# Patient Record
Sex: Female | Born: 1985 | Race: White | Hispanic: No | Smoking: Never smoker
Health system: Southern US, Community
[De-identification: ages and names within clinical notes are randomized; demographics above are authoritative.]

## PROBLEM LIST (undated history)

## (undated) DIAGNOSIS — F419 Anxiety disorder, unspecified: Secondary | ICD-10-CM

## (undated) DIAGNOSIS — G43909 Migraine, unspecified, not intractable, without status migrainosus: Secondary | ICD-10-CM

## (undated) DIAGNOSIS — F329 Major depressive disorder, single episode, unspecified: Secondary | ICD-10-CM

## (undated) DIAGNOSIS — F32A Depression, unspecified: Secondary | ICD-10-CM

## (undated) DIAGNOSIS — F909 Attention-deficit hyperactivity disorder, unspecified type: Secondary | ICD-10-CM

---

## 2016-01-08 ENCOUNTER — Encounter (HOSPITAL_BASED_OUTPATIENT_CLINIC_OR_DEPARTMENT_OTHER): Payer: Self-pay | Admitting: Emergency Medicine

## 2016-01-08 ENCOUNTER — Emergency Department (HOSPITAL_BASED_OUTPATIENT_CLINIC_OR_DEPARTMENT_OTHER): Payer: Managed Care, Other (non HMO)

## 2016-01-08 ENCOUNTER — Emergency Department (HOSPITAL_BASED_OUTPATIENT_CLINIC_OR_DEPARTMENT_OTHER)
Admission: EM | Admit: 2016-01-08 | Discharge: 2016-01-08 | Disposition: A | Discharge status: Home / Self Care | Payer: Managed Care, Other (non HMO) | Attending: Emergency Medicine | Admitting: Emergency Medicine

## 2016-01-08 DIAGNOSIS — Y9389 Activity, other specified: Secondary | ICD-10-CM | POA: Insufficient documentation

## 2016-01-08 DIAGNOSIS — W208XXA Other cause of strike by thrown, projected or falling object, initial encounter: Secondary | ICD-10-CM | POA: Insufficient documentation

## 2016-01-08 DIAGNOSIS — Z79899 Other long term (current) drug therapy: Secondary | ICD-10-CM | POA: Diagnosis not present

## 2016-01-08 DIAGNOSIS — G43909 Migraine, unspecified, not intractable, without status migrainosus: Secondary | ICD-10-CM | POA: Diagnosis not present

## 2016-01-08 DIAGNOSIS — Z791 Long term (current) use of non-steroidal anti-inflammatories (NSAID): Secondary | ICD-10-CM | POA: Diagnosis not present

## 2016-01-08 DIAGNOSIS — F909 Attention-deficit hyperactivity disorder, unspecified type: Secondary | ICD-10-CM | POA: Diagnosis not present

## 2016-01-08 DIAGNOSIS — F329 Major depressive disorder, single episode, unspecified: Secondary | ICD-10-CM | POA: Diagnosis not present

## 2016-01-08 DIAGNOSIS — Y998 Other external cause status: Secondary | ICD-10-CM | POA: Insufficient documentation

## 2016-01-08 DIAGNOSIS — M79641 Pain in right hand: Secondary | ICD-10-CM

## 2016-01-08 DIAGNOSIS — Y9289 Other specified places as the place of occurrence of the external cause: Secondary | ICD-10-CM | POA: Insufficient documentation

## 2016-01-08 DIAGNOSIS — S6991XA Unspecified injury of right wrist, hand and finger(s), initial encounter: Secondary | ICD-10-CM | POA: Diagnosis not present

## 2016-01-08 DIAGNOSIS — F419 Anxiety disorder, unspecified: Secondary | ICD-10-CM | POA: Diagnosis not present

## 2016-01-08 HISTORY — DX: Anxiety disorder, unspecified: F41.9

## 2016-01-08 HISTORY — DX: Major depressive disorder, single episode, unspecified: F32.9

## 2016-01-08 HISTORY — DX: Migraine, unspecified, not intractable, without status migrainosus: G43.909

## 2016-01-08 HISTORY — DX: Depression, unspecified: F32.A

## 2016-01-08 HISTORY — DX: Attention-deficit hyperactivity disorder, unspecified type: F90.9

## 2016-01-08 MED ORDER — ACETAMINOPHEN 325 MG PO TABS
650.0000 mg | ORAL_TABLET | Freq: Once | ORAL | Status: AC
  Administered 2016-01-08: 650 mg via ORAL
  Filled 2016-01-08: qty 2

## 2016-01-08 MED ORDER — IBUPROFEN 800 MG PO TABS: 800.0000 mg | ORAL_TABLET | Freq: Three times a day (TID) | ORAL | Status: DC
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
## 2016-01-08 NOTE — ED Notes (Signed)
CMS intact before and after. Pt tolerated procedure well. Pt did not have any questions.  
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
## 2016-01-08 NOTE — ED Notes (Signed)
In addition to reported medications, pt alsop takes a birth control pill (unknown) and a migraine preventative medication.

## 2016-01-08 NOTE — Discharge Instructions (Signed)
1. Medications: tylenol or motrin for pain, usual home medications 2. Treatment: rest, drink plenty of fluids, ice, elevate 3. Follow Up: please followup with your primary doctor for discussion of your diagnoses and further evaluation after today's visit; if you do not have a primary care doctor use the phone number listed in your discharge paperwork to find one; please return to the ER for increased pain, swelling, numbness, new or worsening symptoms

## 2016-04-08 ENCOUNTER — Emergency Department (HOSPITAL_BASED_OUTPATIENT_CLINIC_OR_DEPARTMENT_OTHER)
Admission: EM | Admit: 2016-04-08 | Discharge: 2016-04-09 | Disposition: A | Discharge status: Home / Self Care | Payer: Managed Care, Other (non HMO) | Attending: Emergency Medicine | Admitting: Emergency Medicine

## 2016-04-08 ENCOUNTER — Emergency Department (HOSPITAL_BASED_OUTPATIENT_CLINIC_OR_DEPARTMENT_OTHER): Payer: Managed Care, Other (non HMO)

## 2016-04-08 ENCOUNTER — Encounter (HOSPITAL_BASED_OUTPATIENT_CLINIC_OR_DEPARTMENT_OTHER): Payer: Self-pay | Admitting: Emergency Medicine

## 2016-04-08 DIAGNOSIS — M549 Dorsalgia, unspecified: Secondary | ICD-10-CM | POA: Insufficient documentation

## 2016-04-08 DIAGNOSIS — R300 Dysuria: Secondary | ICD-10-CM | POA: Diagnosis not present

## 2016-04-08 DIAGNOSIS — R109 Unspecified abdominal pain: Secondary | ICD-10-CM | POA: Insufficient documentation

## 2016-04-08 DIAGNOSIS — R0981 Nasal congestion: Secondary | ICD-10-CM | POA: Diagnosis not present

## 2016-04-08 DIAGNOSIS — F329 Major depressive disorder, single episode, unspecified: Secondary | ICD-10-CM | POA: Insufficient documentation

## 2016-04-08 DIAGNOSIS — R51 Headache: Secondary | ICD-10-CM | POA: Diagnosis not present

## 2016-04-08 DIAGNOSIS — R112 Nausea with vomiting, unspecified: Secondary | ICD-10-CM | POA: Insufficient documentation

## 2016-04-08 LAB — COMPREHENSIVE METABOLIC PANEL
ALBUMIN: 4.2 g/dL (ref 3.5–5.0)
ALK PHOS: 49 U/L (ref 38–126)
ALT: 32 U/L (ref 14–54)
AST: 27 U/L (ref 15–41)
Anion gap: 7 (ref 5–15)
BUN: 21 mg/dL — ABNORMAL HIGH (ref 6–20)
CALCIUM: 9 mg/dL (ref 8.9–10.3)
CHLORIDE: 109 mmol/L (ref 101–111)
CO2: 23 mmol/L (ref 22–32)
CREATININE: 0.61 mg/dL (ref 0.44–1.00)
GFR calc non Af Amer: >60 mL/min (ref >60)
GLUCOSE: 88 mg/dL (ref 65–99)
Potassium: 2.9 mmol/L — ABNORMAL LOW (ref 3.5–5.1)
SODIUM: 139 mmol/L (ref 135–145)
Total Bilirubin: 0.2 mg/dL — ABNORMAL LOW (ref 0.3–1.2)
Total Protein: 7.6 g/dL (ref 6.5–8.1)

## 2016-04-08 LAB — URINE MICROSCOPIC-ADD ON

## 2016-04-08 LAB — URINALYSIS, ROUTINE W REFLEX MICROSCOPIC
BILIRUBIN URINE: NEGATIVE
GLUCOSE, UA: NEGATIVE mg/dL
HGB URINE DIPSTICK: NEGATIVE
KETONES UR: NEGATIVE mg/dL
Nitrite: NEGATIVE
PH: 6.5 (ref 5.0–8.0)
PROTEIN: NEGATIVE mg/dL
Specific Gravity, Urine: 1.018 (ref 1.005–1.030)

## 2016-04-08 LAB — CBC
HCT: 38.9 % (ref 36.0–46.0)
Hemoglobin: 12.7 g/dL (ref 12.0–15.0)
MCH: 28.9 pg (ref 26.0–34.0)
MCHC: 32.6 g/dL (ref 30.0–36.0)
MCV: 88.6 fL (ref 78.0–100.0)
PLATELETS: 251 10*3/uL (ref 150–400)
RBC: 4.39 MIL/uL (ref 3.87–5.11)
RDW: 14.2 % (ref 11.5–15.5)
WBC: 7.6 10*3/uL (ref 4.0–10.5)

## 2016-04-08 LAB — PREGNANCY, URINE: PREG TEST UR: NEGATIVE

## 2016-04-08 LAB — LIPASE, BLOOD: LIPASE: 71 U/L — AB (ref 11–51)

## 2016-04-08 MED ORDER — POTASSIUM CHLORIDE CRYS ER 20 MEQ PO TBCR
40.0000 meq | EXTENDED_RELEASE_TABLET | Freq: Once | ORAL | Status: AC
  Administered 2016-04-09: 40 meq via ORAL
  Filled 2016-04-08: qty 2

## 2016-04-08 MED ORDER — ONDANSETRON HCL 4 MG/2ML IJ SOLN: 4.0000 mg | Freq: Once | INTRAMUSCULAR | Status: DC

## 2016-04-08 MED ORDER — PROMETHAZINE HCL 25 MG PO TABS: 25.0000 mg | ORAL_TABLET | Freq: Four times a day (QID) | ORAL | Status: AC | PRN

## 2016-04-08 MED ORDER — HYDROMORPHONE HCL 1 MG/ML IJ SOLN
1.0000 mg | Freq: Once | INTRAMUSCULAR | Status: AC
Start: 2016-04-08 — End: 2016-04-08
  Administered 2016-04-08: 1 mg via INTRAVENOUS
  Filled 2016-04-08: qty 1

## 2016-04-08 MED ORDER — IOPAMIDOL (ISOVUE-300) INJECTION 61%
100.0000 mL | Freq: Once | INTRAVENOUS | Status: AC | PRN
  Administered 2016-04-08: 100 mL via INTRAVENOUS

## 2016-04-08 MED ORDER — ONDANSETRON HCL 4 MG/2ML IJ SOLN
4.0000 mg | Freq: Once | INTRAMUSCULAR | Status: AC | PRN
  Administered 2016-04-08: 4 mg via INTRAVENOUS
  Filled 2016-04-08: qty 2

## 2016-04-08 MED ORDER — SODIUM CHLORIDE 0.9 % IV BOLUS (SEPSIS)
1000.0000 mL | Freq: Once | INTRAVENOUS | Status: AC
Start: 2016-04-08 — End: 2016-04-08
  Administered 2016-04-08: 1000 mL via INTRAVENOUS

## 2016-04-08 MED ORDER — HYDROMORPHONE HCL 1 MG/ML IJ SOLN
1.0000 mg | Freq: Once | INTRAMUSCULAR | Status: AC
  Administered 2016-04-08: 1 mg via INTRAVENOUS
  Filled 2016-04-08: qty 1

## 2016-04-08 MED ORDER — SODIUM CHLORIDE 0.9 % IV SOLN
INTRAVENOUS | Status: DC
  Administered 2016-04-08: 22:00:00 via INTRAVENOUS

## 2016-04-08 MED ORDER — HYDROCODONE-ACETAMINOPHEN 5-325 MG PO TABS: 1.0000 | ORAL_TABLET | Freq: Four times a day (QID) | ORAL | Status: AC | PRN

## 2016-04-08 NOTE — ED Provider Notes (Signed)
CSN: [id number]     Arrival date & time 04/08/16  1842 History  By signing my name below, I, Terrance Branch, attest that this documentation has been prepared under the direction and in the presence of Vanetta Mulders, MD. Electronically Signed: Evon Slack, ED Scribe. 04/08/2016. 8:40 PM.      Chief Complaint  Patient presents with  . Flank Pain    The history is provided by the patient. No language interpreter was used.   HPI Comments: Rhonda West is a 30 y.o. female who presents to the Emergency Department complaining of intermittent left sided flank pain onset 1 month prior. Pt reports that the pain has recently returned 1 day ago and now is severe and constant. Pt states that the pain does radiate around to her lower left abdomen. She states she has associated back pain, nausea, vomiting, dysuria and hematuria. Pt doesn't report any medications PTA. Pt reports that was evaluated 1 month prior for similar pain. She states that her Ct scan showed no abnormalities.      Past Medical History  Diagnosis Date  . Anxiety   . ADHD (attention deficit hyperactivity disorder)   . Depression   . Migraines    History reviewed. No pertinent past surgical history. History reviewed. No pertinent family history. Social History  Substance Use Topics  . Smoking status: Never Smoker   . Smokeless tobacco: None  . Alcohol Use: No   OB History    No data available      Review of Systems  Constitutional: Negative for fever and chills.  HENT: Positive for congestion and rhinorrhea. Negative for sore throat.   Eyes: Negative for visual disturbance.  Respiratory: Negative for cough and shortness of breath.   Cardiovascular: Negative for chest pain and leg swelling.  Gastrointestinal: Positive for nausea, vomiting and abdominal pain. Negative for diarrhea.  Genitourinary: Positive for dysuria, hematuria and flank pain.  Musculoskeletal: Positive for back pain. Negative for neck pain.   Skin: Negative for rash.  Neurological: Positive for headaches.  Hematological: Does not bruise/bleed easily.  Psychiatric/Behavioral: Negative for confusion.      Allergies  Review of patient's allergies indicates no known allergies.  Home Medications   Prior to Admission medications   Medication Sig Start Date End Date Taking? Authorizing Provider  amphetamine-dextroamphetamine (ADDERALL) 10 MG tablet Take 20 mg by mouth daily with breakfast.     Historical Provider, MD  busPIRone (BUSPAR) 5 MG tablet Take 5 mg by mouth 3 (three) times daily.    Historical Provider, MD  cetirizine (ZYRTEC) 5 MG tablet Take 5 mg by mouth daily.    Historical Provider, MD  citalopram (CELEXA) 40 MG tablet Take 40 mg by mouth daily.    Historical Provider, MD  HYDROcodone-acetaminophen (NORCO/VICODIN) 5-325 MG tablet Take 1-2 tablets by mouth every 6 (six) hours as needed for moderate pain. 04/08/16   Vanetta Mulders, MD  ibuprofen (ADVIL,MOTRIN) 800 MG tablet Take 1 tablet (800 mg total) by mouth 3 (three) times daily. 01/08/16   Mady Gemma, PA-C  promethazine (PHENERGAN) 25 MG tablet Take 1 tablet (25 mg total) by mouth every 6 (six) hours as needed for nausea or vomiting. 04/08/16   Vanetta Mulders, MD  SUMAtriptan (IMITREX) 100 MG tablet Take 100 mg by mouth every 2 (two) hours as needed for migraine. May repeat in 2 hours if headache persists or recurs.    Historical Provider, MD  traZODone (DESYREL) 50 MG tablet Take 50 mg by  mouth at bedtime.    Historical Provider, MD   BP 129/96 mmHg  Pulse 75  Temp(Src) 97.6 F (36.4 C) (Oral)  Resp 20  Ht 5\' 2"  (1.575 m)  Wt 46.72 kg  BMI 18.83 kg/m2  SpO2 100%   Physical Exam  Constitutional: She is oriented to person, place, and time. She appears well-developed and well-nourished. No distress.  HENT:  Head: Normocephalic and atraumatic.  Mouth/Throat: Oropharynx is clear and moist.  Eyes: Conjunctivae and EOM are normal. Pupils are equal,  round, and reactive to light. No scleral icterus.  Neck: Neck supple. No tracheal deviation present.  Cardiovascular: Normal rate, regular rhythm and normal heart sounds.   Pulmonary/Chest: Effort normal. No respiratory distress.  Abdominal: Bowel sounds are normal. There is CVA tenderness. There is no guarding.  Left flank tenderness.   Musculoskeletal: Normal range of motion.  Neurological: She is alert and oriented to person, place, and time. No cranial nerve deficit.  Skin: Skin is warm and dry.  Psychiatric: She has a normal mood and affect. Her behavior is normal.  Nursing note and vitals reviewed.   ED Course  Procedures (including critical care time) DIAGNOSTIC STUDIES: Oxygen Saturation is 100% on RA, normal by my interpretation.    COORDINATION OF CARE: 8:39 PM-Discussed treatment plan which includes  UA and CT scan with pt at bedside and pt agreed to plan.     Labs Review Labs Reviewed  URINALYSIS, ROUTINE W REFLEX MICROSCOPIC (NOT AT [facility]) - Abnormal; Notable for the following:    Leukocytes, UA MODERATE (*)    All other components within normal limits  URINE MICROSCOPIC-ADD ON - Abnormal; Notable for the following:    Squamous Epithelial / LPF 6-30 (*)    Bacteria, UA RARE (*)    All other components within normal limits  LIPASE, BLOOD - Abnormal; Notable for the following:    Lipase 71 (*)    All other components within normal limits  COMPREHENSIVE METABOLIC PANEL - Abnormal; Notable for the following:    Potassium 2.9 (*)    BUN 21 (*)    Total Bilirubin 0.2 (*)    All other components within normal limits  PREGNANCY, URINE  CBC   Results for orders placed or performed during the hospital encounter of 04/08/16  Pregnancy, urine  Result Value Ref Range   Preg Test, Ur NEGATIVE NEGATIVE  Urinalysis, Routine w reflex microscopic (not at [facility])  Result Value Ref Range   Color, Urine YELLOW YELLOW   APPearance CLEAR CLEAR   Specific Gravity, Urine 1.018  1.005 - 1.030   pH 6.5 5.0 - 8.0   Glucose, UA NEGATIVE NEGATIVE mg/dL   Hgb urine dipstick NEGATIVE NEGATIVE   Bilirubin Urine NEGATIVE NEGATIVE   Ketones, ur NEGATIVE NEGATIVE mg/dL   Protein, ur NEGATIVE NEGATIVE mg/dL   Nitrite NEGATIVE NEGATIVE   Leukocytes, UA MODERATE (A) NEGATIVE  Urine microscopic-add on  Result Value Ref Range   Squamous Epithelial / LPF 6-30 (A) NONE SEEN   WBC, UA 6-30 0 - 5 WBC/hpf   RBC / HPF 0-5 0 - 5 RBC/hpf   Bacteria, UA RARE (A) NONE SEEN   Urine-Other MUCOUS PRESENT   Lipase, blood  Result Value Ref Range   Lipase 71 (H) 11 - 51 U/L  Comprehensive metabolic panel  Result Value Ref Range   Sodium 139 135 - 145 mmol/L   Potassium 2.9 (L) 3.5 - 5.1 mmol/L   Chloride 109 101 - 111 mmol/L  CO2 23 22 - 32 mmol/L   Glucose, Bld 88 65 - 99 mg/dL   BUN 21 (H) 6 - 20 mg/dL   Creatinine, Ser 1.610.61 0.44 - 1.00 mg/dL   Calcium 9.0 8.9 - 09.610.3 mg/dL   Total Protein 7.6 6.5 - 8.1 g/dL   Albumin 4.2 3.5 - 5.0 g/dL   AST 27 15 - 41 U/L   ALT 32 14 - 54 U/L   Alkaline Phosphatase 49 38 - 126 U/L   Total Bilirubin 0.2 (L) 0.3 - 1.2 mg/dL   GFR calc non Af Amer >60 >60 mL/min   GFR calc Af Amer >60 >60 mL/min   Anion gap 7 5 - 15  CBC  Result Value Ref Range   WBC 7.6 4.0 - 10.5 K/uL   RBC 4.39 3.87 - 5.11 MIL/uL   Hemoglobin 12.7 12.0 - 15.0 g/dL   HCT 04.538.9 40.936.0 - 81.146.0 %   MCV 88.6 78.0 - 100.0 fL   MCH 28.9 26.0 - 34.0 pg   MCHC 32.6 30.0 - 36.0 g/dL   RDW 91.414.2 78.211.5 - 95.615.5 %   Platelets 251 150 - 400 K/uL     Imaging Review Ct Abdomen Pelvis W Contrast  04/08/2016  CLINICAL DATA:  Intermittent left-sided flank pain for 1 month. Pain radiates to the left eye removed. Nausea, vomiting, dysuria, and hematuria. EXAM: CT ABDOMEN AND PELVIS WITH CONTRAST TECHNIQUE: Multidetector CT imaging of the abdomen and pelvis was performed using the standard protocol following bolus administration of intravenous contrast. CONTRAST:  100mL ISOVUE-300 IOPAMIDOL  (ISOVUE-300) INJECTION 61% COMPARISON:  CT 03/12/2016.  MRI abdomen 04/06/2016. FINDINGS: Mild dependent atelectasis in the bases. Small focal low-attenuation lesions are demonstrated in the left lobe of the liver probably representing areas of focal fatty infiltration. The gallbladder, pancreas, adrenal glands, kidneys, abdominal aorta, inferior vena cava, and retroperitoneal lymph nodes are unremarkable. Stomach, small bowel, and colon are not abnormally distended. Diffusely stool-filled colon. There is possible wall thickening in the duodenum which could be due to underdistention or duodenitis. No free air or free fluid in the abdomen. Pelvis: Uterus and ovaries are not enlarged. Bladder wall is not thickened. No free or loculated pelvic fluid collections. No pelvic mass or lymphadenopathy. Appendix is normal. No destructive bone lesions. IMPRESSION: Suggestion of wall thickening in the duodenum which could indicate under distention or duodenitis. Otherwise, no acute process demonstrated in the abdomen or pelvis. Probable focal areas of fatty infiltration. Electronically Signed   By: Burman NievesWilliam  Stevens M.D.   On: 04/08/2016 22:40   I have personally reviewed and evaluated these images and lab results as part of my medical decision-making.   EKG Interpretation None      MDM   Final diagnoses:  Left flank pain   The patient later informed us that she's been following [place] GI medicine [place] [facility] [place]. She had significant elevation of her lipase and liver function enzymes recently. Had an MR of the gallbladder area done on Thursday and results are pending. Patient's lipase still slightly elevated but she said it got as high as 120 today at 71. Liver function tests are completely normal. CT of the gallbladder and pancreas area is normal. Will treat patient symptomatically. We'll have her follow-up with GI medicine lab results from here and her CT results provided to patient to help her with her GI  doctors. Patient does show a little bit of inflammation in the first part of the duodenum. All of her GI  doctors take that into consideration with the rest of the workup.  I personally performed the services described in this documentation, which was scribed in my presence. The recorded information has been reviewed and is accurate.        Vanetta Mulders, MD 04/09/16 0001

## 2016-04-08 NOTE — ED Notes (Signed)
MD at bedside. 

## 2016-04-08 NOTE — ED Notes (Signed)
Patient has pain to her left flank and abdominal area

## 2016-04-08 NOTE — Discharge Instructions (Signed)
Workup here today without any significant findings. Still with slight elevation of your lipase. CAT scan without any acute findings other than some inflammation in the duodenal area.  follow-up with your GI doctors by phone on Monday. And have them take it from here. labs and CT results provided. Take pain medicine as needed take the Phenergan as needed for nausea and vomiting.

## 2016-11-13 ENCOUNTER — Emergency Department (HOSPITAL_BASED_OUTPATIENT_CLINIC_OR_DEPARTMENT_OTHER): Payer: Managed Care, Other (non HMO)

## 2016-11-13 ENCOUNTER — Emergency Department (HOSPITAL_BASED_OUTPATIENT_CLINIC_OR_DEPARTMENT_OTHER)
Admission: EM | Admit: 2016-11-13 | Discharge: 2016-11-14 | Disposition: A | Discharge status: Home / Self Care | Payer: Managed Care, Other (non HMO) | Attending: Emergency Medicine | Admitting: Emergency Medicine

## 2016-11-13 ENCOUNTER — Encounter (HOSPITAL_BASED_OUTPATIENT_CLINIC_OR_DEPARTMENT_OTHER): Payer: Self-pay | Admitting: Emergency Medicine

## 2016-11-13 DIAGNOSIS — R091 Pleurisy: Secondary | ICD-10-CM | POA: Diagnosis not present

## 2016-11-13 DIAGNOSIS — R05 Cough: Secondary | ICD-10-CM | POA: Diagnosis not present

## 2016-11-13 DIAGNOSIS — R0602 Shortness of breath: Secondary | ICD-10-CM | POA: Diagnosis present

## 2016-11-13 DIAGNOSIS — R079 Chest pain, unspecified: Secondary | ICD-10-CM | POA: Insufficient documentation

## 2016-11-13 DIAGNOSIS — F909 Attention-deficit hyperactivity disorder, unspecified type: Secondary | ICD-10-CM | POA: Diagnosis not present

## 2016-11-13 DIAGNOSIS — R071 Chest pain on breathing: Secondary | ICD-10-CM

## 2016-11-13 DIAGNOSIS — M7989 Other specified soft tissue disorders: Secondary | ICD-10-CM | POA: Diagnosis not present

## 2016-11-13 LAB — COMPREHENSIVE METABOLIC PANEL
ALBUMIN: 4.3 g/dL (ref 3.5–5.0)
ALT: 43 U/L (ref 14–54)
ANION GAP: 7 (ref 5–15)
AST: 36 U/L (ref 15–41)
Alkaline Phosphatase: 47 U/L (ref 38–126)
BUN: 19 mg/dL (ref 6–20)
CO2: 21 mmol/L — AB (ref 22–32)
Calcium: 9.1 mg/dL (ref 8.9–10.3)
Chloride: 109 mmol/L (ref 101–111)
Creatinine, Ser: 0.73 mg/dL (ref 0.44–1.00)
GFR calc Af Amer: >60 mL/min (ref >60)
GFR calc non Af Amer: >60 mL/min (ref >60)
GLUCOSE: 93 mg/dL (ref 65–99)
POTASSIUM: 3.3 mmol/L — AB (ref 3.5–5.1)
SODIUM: 137 mmol/L (ref 135–145)
Total Bilirubin: 0.5 mg/dL (ref 0.3–1.2)
Total Protein: 7.7 g/dL (ref 6.5–8.1)

## 2016-11-13 LAB — CBC WITH DIFFERENTIAL/PLATELET
Basophils Absolute: 0 10*3/uL (ref 0.0–0.1)
Basophils Relative: 0 %
EOS ABS: 0 10*3/uL (ref 0.0–0.7)
Eosinophils Relative: 0 %
HCT: 39 % (ref 36.0–46.0)
HEMOGLOBIN: 12.6 g/dL (ref 12.0–15.0)
Lymphocytes Relative: 20 %
Lymphs Abs: 1.5 10*3/uL (ref 0.7–4.0)
MCH: 29 pg (ref 26.0–34.0)
MCHC: 32.3 g/dL (ref 30.0–36.0)
MCV: 89.9 fL (ref 78.0–100.0)
MONO ABS: 0.6 10*3/uL (ref 0.1–1.0)
MONOS PCT: 8 %
NEUTROS ABS: 5.4 10*3/uL (ref 1.7–7.7)
NEUTROS PCT: 72 %
Platelets: 222 10*3/uL (ref 150–400)
RBC: 4.34 MIL/uL (ref 3.87–5.11)
RDW: 15.2 % (ref 11.5–15.5)
WBC: 7.5 10*3/uL (ref 4.0–10.5)

## 2016-11-13 LAB — TROPONIN I: Troponin I: <0.03 ng/mL (ref <0.03)

## 2016-11-13 LAB — HCG, SERUM, QUALITATIVE: Preg, Serum: NEGATIVE

## 2016-11-13 MED ORDER — IOPAMIDOL (ISOVUE-370) INJECTION 76%
100.0000 mL | Freq: Once | INTRAVENOUS | Status: AC | PRN
  Administered 2016-11-13: 100 mL via INTRAVENOUS

## 2016-11-13 MED ORDER — ALBUTEROL SULFATE (2.5 MG/3ML) 0.083% IN NEBU
5.0000 mg | INHALATION_SOLUTION | Freq: Once | RESPIRATORY_TRACT | Status: AC
  Administered 2016-11-13: 5 mg via RESPIRATORY_TRACT
  Filled 2016-11-13: qty 6

## 2016-11-13 MED ORDER — IOPAMIDOL (ISOVUE-300) INJECTION 61%: 100.0000 mL | Freq: Once | INTRAVENOUS | Status: DC | PRN

## 2016-11-13 MED ORDER — KETOROLAC TROMETHAMINE 30 MG/ML IJ SOLN
30.0000 mg | Freq: Once | INTRAMUSCULAR | Status: AC
  Administered 2016-11-13: 30 mg via INTRAVENOUS
  Filled 2016-11-13: qty 1

## 2016-11-13 NOTE — ED Notes (Signed)
ED Provider at bedside. 

## 2016-11-13 NOTE — ED Notes (Signed)
Patient transported to CT 

## 2016-11-13 NOTE — ED Triage Notes (Addendum)
Patient has had several treatments for bronchits and possible PNA at home since Dec. PAteint reports that during none of those treatments did she have this SOB. The patient reports that she is started to have chest pain now with the SOB and it worsened last night when she laid down. Patient reports that she feels like there is a heaviness to her chest and "feels like my lungs are collapsing" feels like there is no air. Hurts when she takes a deep breath it hurts like a stabbing pain

## 2016-11-13 NOTE — ED Provider Notes (Signed)
[facility] Note   CSN: [id number] Arrival date & time: 11/13/16  1929   By signing my name below, I, Rosario Adie, attest that this documentation has been prepared under the direction and in the presence of Loren Racer, MD. Electronically Signed: Rosario Adie, ED Scribe. 11/13/16. 9:29 PM.  History   Chief Complaint Chief Complaint  Patient presents with  . Shortness of Breath   HPI Rhonda West is a 31 y.o. female with no pertinent PMHx, who presents to the Emergency Department complaining of persistent SOB since last night, worsening throughout the day today. She notes associated left-sided chest pain. Her pain is exacerbated with deep inspirations and laying supine. She additionally reports that her right leg was swollen three days ago, but this has since resolved. She also notes that she has been experiencing a non-productive cough as well; however, has had Bronchitis off and on for the last 6- 8 weeks. She was seen by her PCP for this issue three days ago, and was given steroids at that time. Pt was also given a breathing treatment while in the ED which she states only minimally improved her SOB. No Hx of PE/DVT, recent long travel, surgery, fracture, prolonged immobilization. She is currently tolerating BCP.She denies sore throat, rhinorrhea, abdominal pain, fever, or any other associated symptoms.   The history is provided by the patient and a parent. No language interpreter was used.   Past Medical History:  Diagnosis Date  . ADHD (attention deficit hyperactivity disorder)   . Anxiety   . Depression   . Migraines    There are no active problems to display for this patient.  History reviewed. No pertinent surgical history.  OB History    No data available     Home Medications    Prior to Admission medications   Medication Sig Start Date End Date Taking? Authorizing Provider  amphetamine-dextroamphetamine (ADDERALL) 10 MG  tablet Take 20 mg by mouth daily with breakfast.     Historical Provider, MD  busPIRone (BUSPAR) 5 MG tablet Take 5 mg by mouth 3 (three) times daily.    Historical Provider, MD  cetirizine (ZYRTEC) 5 MG tablet Take 5 mg by mouth daily.    Historical Provider, MD  citalopram (CELEXA) 40 MG tablet Take 40 mg by mouth daily.    Historical Provider, MD  HYDROcodone-acetaminophen (NORCO/VICODIN) 5-325 MG tablet Take 1-2 tablets by mouth every 6 (six) hours as needed for moderate pain. 04/08/16   Vanetta Mulders, MD  ibuprofen (ADVIL,MOTRIN) 600 MG tablet Take 1 tablet (600 mg total) by mouth 3 (three) times daily after meals. 11/14/16   Loren Racer, MD  promethazine (PHENERGAN) 25 MG tablet Take 1 tablet (25 mg total) by mouth every 6 (six) hours as needed for nausea or vomiting. 04/08/16   Vanetta Mulders, MD  SUMAtriptan (IMITREX) 100 MG tablet Take 100 mg by mouth every 2 (two) hours as needed for migraine. May repeat in 2 hours if headache persists or recurs.    Historical Provider, MD  traZODone (DESYREL) 50 MG tablet Take 50 mg by mouth at bedtime.    Historical Provider, MD    Family History History reviewed. No pertinent family history.  Social History Social History  Substance Use Topics  . Smoking status: Never Smoker  . Smokeless tobacco: Never Used  . Alcohol use No     Allergies   Patient has no known allergies.   Review of Systems Review of Systems  Constitutional: Negative for chills, fatigue and fever.  HENT: Negative for congestion and sore throat.   Respiratory: Positive for cough and shortness of breath. Negative for wheezing.   Cardiovascular: Positive for chest pain and leg swelling. Negative for palpitations.  Gastrointestinal: Negative for abdominal pain, constipation, diarrhea, nausea and vomiting.  Genitourinary: Negative for dysuria, flank pain and frequency.  Musculoskeletal: Negative for back pain, myalgias, neck pain and neck stiffness.  Skin: Negative  for rash and wound.  Neurological: Negative for dizziness, weakness, light-headedness, numbness and headaches.  All other systems reviewed and are negative.    Physical Exam Updated Vital Signs BP 123/84   Pulse 89   Temp 98.3 F (36.8 C) (Oral)   Resp 19   Ht 5\' 2"  (1.575 m)   Wt 108 lb (49 kg)   LMP 11/13/2016   SpO2 100%   BMI 19.75 kg/m   Physical Exam  Constitutional: She is oriented to person, place, and time. She appears well-developed and well-nourished. No distress.  HENT:  Head: Normocephalic and atraumatic.  Mouth/Throat: Oropharynx is clear and moist. No oropharyngeal exudate.  Eyes: EOM are normal. Pupils are equal, round, and reactive to light.  Neck: Normal range of motion. Neck supple. No JVD present.  Cardiovascular: Normal rate, regular rhythm and intact distal pulses.  Exam reveals no gallop and no friction rub.   No murmur heard. Pulmonary/Chest: Effort normal and breath sounds normal. No respiratory distress. She has no wheezes. She has no rales. She exhibits no tenderness.  Abdominal: Soft. Bowel sounds are normal. There is no tenderness. There is no rebound and no guarding.  Musculoskeletal: Normal range of motion. She exhibits no edema or tenderness.  No lower extremity swelling, asymmetry or tenderness. Distal pulses are 2+.  Neurological: She is alert and oriented to person, place, and time.  Moves all extremities without deficit. Sensation fully intact.  Skin: Skin is warm and dry. Capillary refill takes less than 2 seconds. No rash noted. No erythema.  Psychiatric: She has a normal mood and affect. Her behavior is normal.  Nursing note and vitals reviewed.    ED Treatments / Results  DIAGNOSTIC STUDIES: Oxygen Saturation is 100% on RA, normal by my interpretation.    COORDINATION OF CARE: 9:21 PM-Discussed next steps with pt. Pt verbalized understanding and is agreeable with the plan.    Labs (all labs ordered are listed, but only  abnormal results are displayed) Labs Reviewed  COMPREHENSIVE METABOLIC PANEL - Abnormal; Notable for the following:       Result Value   Potassium 3.3 (*)    CO2 21 (*)    All other components within normal limits  CBC WITH DIFFERENTIAL/PLATELET  TROPONIN I  HCG, SERUM, QUALITATIVE    EKG  EKG Interpretation  Date/Time:  Monday November 13 2016 19:51:30 EST Ventricular Rate:  82 PR Interval:  110 QRS Duration: 96 QT Interval:  356 QTC Calculation: 415 R Axis:   93 Text Interpretation:  Sinus rhythm with sinus arrhythmia with short PR Rightward axis Cannot rule out Anterior infarct , age undetermined Abnormal ECG Confirmed by Ranae PalmsYELVERTON  MD, Anetha Slagel ([id number]) on 11/13/2016 9:22:48 PM       Radiology Dg Chest 2 View  Result Date: 11/13/2016 CLINICAL DATA:  Chest pain and shortness of breath. Chest heaviness. Symptoms worsened last night. EXAM: CHEST  2 VIEW COMPARISON:  None. FINDINGS: Lungs are clear. Heart size is normal. No pneumothorax or pleural fluid. No acute bony abnormality. IMPRESSION: Negative chest.  Electronically Signed   By: Drusilla Kanner M.D.   On: 11/13/2016 20:28   Ct Angio Chest Pe W Or Wo Contrast  Result Date: 11/13/2016 CLINICAL DATA:  Acute onset of shortness of breath and left anterior chest pain. Right leg swelling. Initial encounter. EXAM: CT ANGIOGRAPHY CHEST WITH CONTRAST TECHNIQUE: Multidetector CT imaging of the chest was performed using the standard protocol during bolus administration of intravenous contrast. Multiplanar CT image reconstructions and MIPs were obtained to evaluate the vascular anatomy. CONTRAST:  100 mL of Isovue 370 IV contrast COMPARISON:  Chest radiograph performed earlier today at 8:11 p.m. FINDINGS: Cardiovascular:  There is no evidence of pulmonary embolus. The heart is normal in size. The thoracic aorta is unremarkable. No calcific atherosclerotic disease is seen. The great vessels are grossly unremarkable in appearance.  Mediastinum/Nodes: The mediastinum is unremarkable in appearance. No mediastinal lymphadenopathy is seen. No pericardial effusion is identified. The visualized portions of the thyroid gland are unremarkable. No axillary lymphadenopathy is appreciated. Lungs/Pleura: The lungs are clear bilaterally. No focal consolidation, pleural effusion or pneumothorax is seen. No masses are identified. Upper Abdomen: The visualized portions of the liver and spleen are unremarkable. The visualized portions of the gallbladder, pancreas, adrenal glands and kidneys are within normal limits. Musculoskeletal: No acute osseous abnormalities are identified. The visualized musculature is unremarkable in appearance. Review of the MIP images confirms the above findings. IMPRESSION: 1. No evidence of pulmonary embolus. 2. Lungs clear bilaterally. Electronically Signed   By: Roanna Raider M.D.   On: 11/13/2016 22:41    Procedures Procedures (including critical care time)  Medications Ordered in ED Medications  albuterol (PROVENTIL) (2.5 MG/3ML) 0.083% nebulizer solution 5 mg (5 mg Nebulization Given 11/13/16 2104)  iopamidol (ISOVUE-370) 76 % injection 100 mL (100 mLs Intravenous Contrast Given 11/13/16 2204)  ketorolac (TORADOL) 30 MG/ML injection 30 mg (30 mg Intravenous Given 11/13/16 2325)     Initial Impression / Assessment and Plan / ED Course  I have reviewed the triage vital signs and the nursing notes.  Pertinent labs & imaging results that were available during my care of the patient were reviewed by me and considered in my medical decision making (see chart for details).     CT angiogram without evidence of PE. Normal white blood cell count. Likely pleurisy. Will start on NSAID and follow-up with primary physician. Return precautions given.  Final Clinical Impressions(s) / ED Diagnoses   Final diagnoses:  Chest pain  Pleurisy    New Prescriptions New Prescriptions   IBUPROFEN (ADVIL,MOTRIN) 600 MG  TABLET    Take 1 tablet (600 mg total) by mouth 3 (three) times daily after meals.    I personally performed the services described in this documentation, which was scribed in my presence. The recorded information has been reviewed and is accurate.       Loren Racer, MD 11/14/16 0001

## 2016-11-14 MED ORDER — IBUPROFEN 600 MG PO TABS: 600.0000 mg | ORAL_TABLET | Freq: Three times a day (TID) | ORAL | 0 refills | Status: AC

## 2017-04-14 ENCOUNTER — Emergency Department (HOSPITAL_BASED_OUTPATIENT_CLINIC_OR_DEPARTMENT_OTHER)
Admission: EM | Admit: 2017-04-14 | Discharge: 2017-04-14 | Disposition: A | Discharge status: Home / Self Care | Payer: Managed Care, Other (non HMO) | Attending: Emergency Medicine | Admitting: Emergency Medicine

## 2017-04-14 ENCOUNTER — Emergency Department (HOSPITAL_BASED_OUTPATIENT_CLINIC_OR_DEPARTMENT_OTHER): Payer: Managed Care, Other (non HMO)

## 2017-04-14 ENCOUNTER — Encounter (HOSPITAL_BASED_OUTPATIENT_CLINIC_OR_DEPARTMENT_OTHER): Payer: Self-pay | Admitting: Emergency Medicine

## 2017-04-14 DIAGNOSIS — W230XXA Caught, crushed, jammed, or pinched between moving objects, initial encounter: Secondary | ICD-10-CM | POA: Insufficient documentation

## 2017-04-14 DIAGNOSIS — Z79899 Other long term (current) drug therapy: Secondary | ICD-10-CM | POA: Diagnosis not present

## 2017-04-14 DIAGNOSIS — F909 Attention-deficit hyperactivity disorder, unspecified type: Secondary | ICD-10-CM | POA: Diagnosis not present

## 2017-04-14 DIAGNOSIS — Y999 Unspecified external cause status: Secondary | ICD-10-CM | POA: Diagnosis not present

## 2017-04-14 DIAGNOSIS — Y929 Unspecified place or not applicable: Secondary | ICD-10-CM | POA: Insufficient documentation

## 2017-04-14 DIAGNOSIS — Y93E3 Activity, vacuuming: Secondary | ICD-10-CM | POA: Insufficient documentation

## 2017-04-14 DIAGNOSIS — S6990XA Unspecified injury of unspecified wrist, hand and finger(s), initial encounter: Secondary | ICD-10-CM | POA: Insufficient documentation

## 2017-04-14 NOTE — Discharge Instructions (Signed)
Finger should improve over the next few days.  Keep clean with soap and warm water.  Can apply ice to help with swelling/bruising. Can follow-up with your primary care doctor or the hand specialist if you have ongoing issues. Return here for any new/worsening symptoms.

## 2017-04-14 NOTE — ED Provider Notes (Signed)
[facility]   CSN: [id number] Arrival date & time: 04/14/17  1435     History   Chief Complaint Chief Complaint  Patient presents with  . Finger Injury    HPI Rhonda West is a 31 y.o. female.  The history is provided by the patient and medical records.   31 y.o. F with hx of ADHD, depression, anxiety, migraine headaches, presenting to the ED for right thumb injury.  Patient states she was cleaning the vacuum when her son turned it on.  States immediate onset of pain and swelling.  She did sustain abrasion to thumb as well.  Reports finger fels "tingly".  No other injuries.  Tetanus UTD.  Past Medical History:  Diagnosis Date  . ADHD (attention deficit hyperactivity disorder)   . Anxiety   . Depression   . Migraines     There are no active problems to display for this patient.   History reviewed. No pertinent surgical history.  OB History    No data available       Home Medications    Prior to Admission medications   Medication Sig Start Date End Date Taking? Authorizing Provider  amphetamine-dextroamphetamine (ADDERALL) 10 MG tablet Take 20 mg by mouth daily with breakfast.     [provider]  busPIRone (BUSPAR) 5 MG tablet Take 5 mg by mouth 3 (three) times daily.    [provider]  cetirizine (ZYRTEC) 5 MG tablet Take 5 mg by mouth daily.    [provider]  citalopram (CELEXA) 40 MG tablet Take 40 mg by mouth daily.    [provider]  HYDROcodone-acetaminophen (NORCO/VICODIN) 5-325 MG tablet Take 1-2 tablets by mouth every 6 (six) hours as needed for moderate pain. 04/08/16   Vanetta MuldersZackowski, Scott, MD  ibuprofen (ADVIL,MOTRIN) 600 MG tablet Take 1 tablet (600 mg total) by mouth 3 (three) times daily after meals. 11/14/16   Loren RacerYelverton, David, MD  promethazine (PHENERGAN) 25 MG tablet Take 1 tablet (25 mg total) by mouth every 6 (six) hours as needed for nausea or vomiting. 04/08/16   Vanetta MuldersZackowski, Scott, MD    SUMAtriptan (IMITREX) 100 MG tablet Take 100 mg by mouth every 2 (two) hours as needed for migraine. May repeat in 2 hours if headache persists or recurs.    [provider]  traZODone (DESYREL) 50 MG tablet Take 50 mg by mouth at bedtime.    [provider]    Family History History reviewed. No pertinent family history.  Social History Social History  Substance Use Topics  . Smoking status: Never Smoker  . Smokeless tobacco: Never Used  . Alcohol use No     Allergies   Patient has no known allergies.   Review of Systems Review of Systems  Skin: Positive for wound.  All other systems reviewed and are negative.    Physical Exam Updated Vital Signs BP (!) 114/94 (BP Location: Right Arm)   Pulse 82   Temp 97.9 F (36.6 C) (Oral)   Resp 17   Ht 5\' 2"  (1.575 m)   Wt 49.9 kg (110 lb)   LMP 04/10/2017   SpO2 100%   BMI 20.12 kg/m   Physical Exam  Constitutional: She is oriented to person, place, and time. She appears well-developed and well-nourished.  HENT:  Head: Normocephalic and atraumatic.  Mouth/Throat: Oropharynx is clear and moist.  Eyes: Pupils are equal, round, and reactive to light. Conjunctivae and EOM are normal.  Neck: Normal  range of motion.  Cardiovascular: Normal rate, regular rhythm and normal heart sounds.   Pulmonary/Chest: Effort normal and breath sounds normal.  Abdominal: Soft. Bowel sounds are normal.  Musculoskeletal: Normal range of motion.  Right thumb with abrasion and skin avulsion across the IP joint along dorsal surface; there is no active bleeding; no apparent deep tissue, vessel, or tendon involvement; limited flexion/extension due to swelling; bruising has started to form; normal sensation, normal cap refill  Neurological: She is alert and oriented to person, place, and time.  Skin: Skin is warm and dry.  Psychiatric: She has a normal mood and affect.  Nursing note and vitals reviewed.    ED Treatments /  Results  Labs (all labs ordered are listed, but only abnormal results are displayed) Labs Reviewed - No data to display  EKG  EKG Interpretation None       Radiology Dg Finger Thumb Left  Result Date: 04/14/2017 CLINICAL DATA:  Left thumb got caught in a vacuum cleaner. EXAM: LEFT THUMB 2+V COMPARISON:  None. FINDINGS: There is no evidence of fracture or dislocation. There is no evidence of arthropathy or other focal bone abnormality. Soft tissues are unremarkable IMPRESSION: Negative. Electronically Signed   By: Kennith Center M.D.   On: 04/14/2017 15:08    Procedures Procedures (including critical care time)  Medications Ordered in ED Medications - No data to display   Initial Impression / Assessment and Plan / ED Course  I have reviewed the triage vital signs and the nursing notes.  Pertinent labs & imaging results that were available during my care of the patient were reviewed by me and considered in my medical decision making (see chart for details).  31 y.o. F here with right thumb injury while cleaning vacuum.  Abrasion and skin avulsion noted but no acute laceration requiring repair.  Some bruising and swelling associated but no bony deformities.  Limited ROM due to pain/swelling.  Neurovascularly intact.  X-ray negative.  Tetanus UTD.  Wound cleansed, dressed.  Splint applied.  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  Given work note for a few days.  Will need follow-up with hand specialist or PCP if not improving in the next few days.  Discussed plan with patient, she acknowledged understanding and agreed with plan of care.  Return precautions given for new or worsening symptoms.  Final Clinical Impressions(s) / ED Diagnoses   Final diagnoses:  Thumb injury, initial encounter    New Prescriptions New Prescriptions   No medications on file     Garlon Hatchet, PA-C 04/14/17 1844    Derwood Kaplan, MD 04/14/17 [phone]

## 2017-04-14 NOTE — ED Triage Notes (Signed)
Patient states that she was cleaning out her vacuum and her sone turned it on. Her left thumb has a noted abrasion and swelling

## 2018-04-25 IMAGING — CT CT ABD-PELV W/ CM
2 of 4 series · 16 of 46 positions shown, 18 images · IV contrast (iopamidol)
Comparison: CT 03/12/2016.  MRI abdomen 04/06/2016.

CLINICAL DATA: Intermittent left-sided flank pain for 1 month. Pain
radiates to the left eye removed. Nausea, vomiting, dysuria, and
hematuria.

EXAM:
CT ABDOMEN AND PELVIS WITH CONTRAST
TECHNIQUE: Multidetector CT imaging of the abdomen and pelvis was performed
using the standard protocol following bolus administration of
intravenous contrast.
CONTRAST:  100mL F6WXJ1-XEE IOPAMIDOL (F6WXJ1-XEE) INJECTION 61%

[Series 2: axial st · axial · 0.58mm/px · z∈[+452,+837]mm · 13 of 85 slices shown, 15 images]
[im 4/85  soft-tissue]
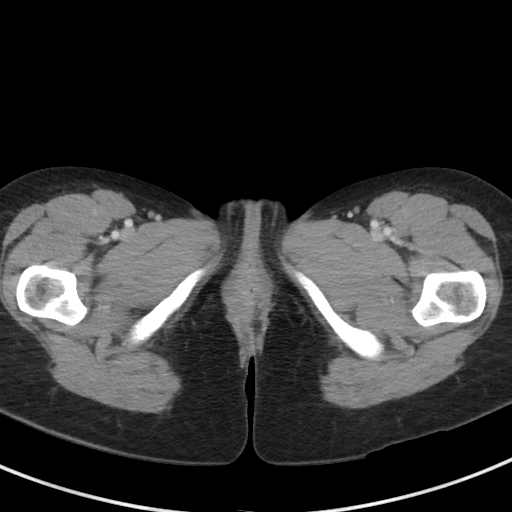
[im 4/85  bone]
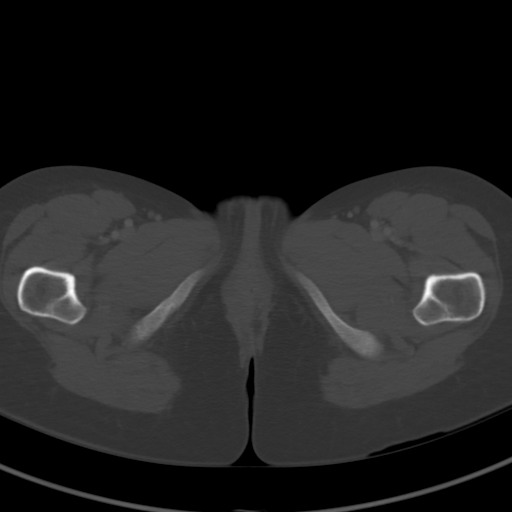
[im 10/85  soft-tissue]
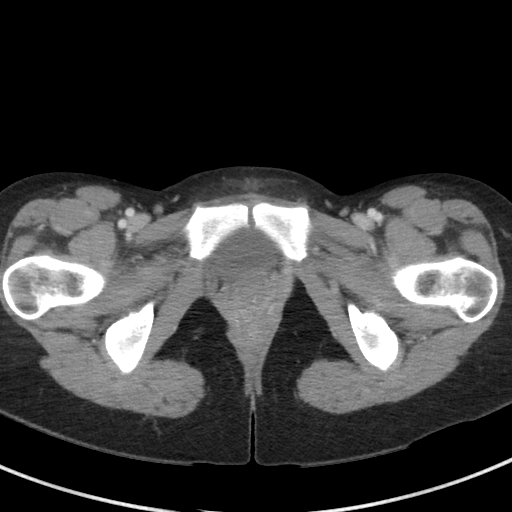
[im 17/85  soft-tissue]
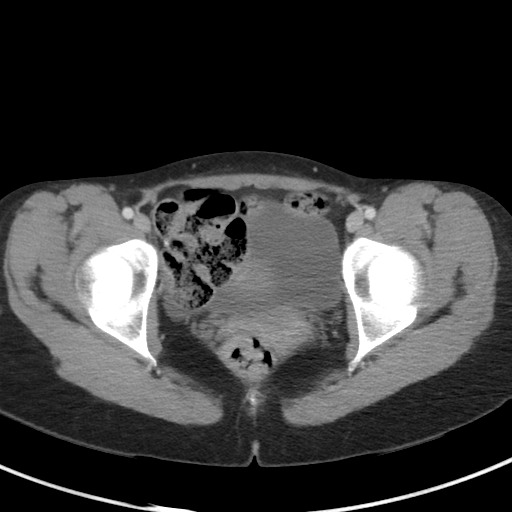
[im 23/85  soft-tissue]
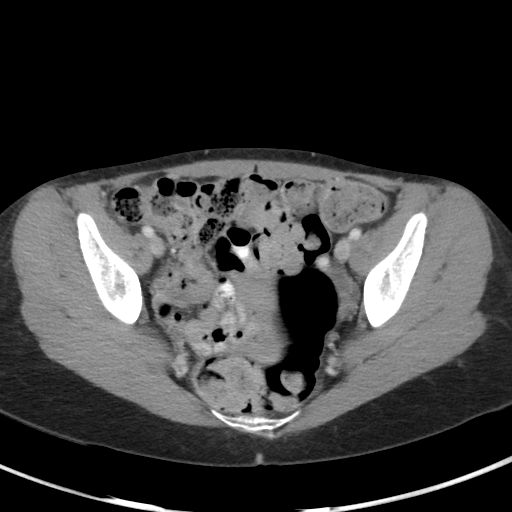
[im 30/85  soft-tissue]
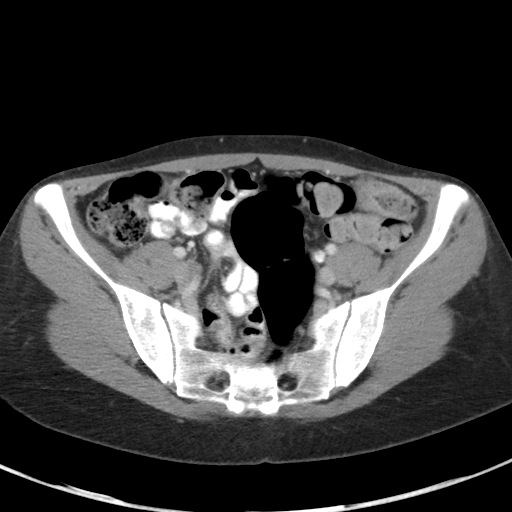
[im 36/85  soft-tissue]
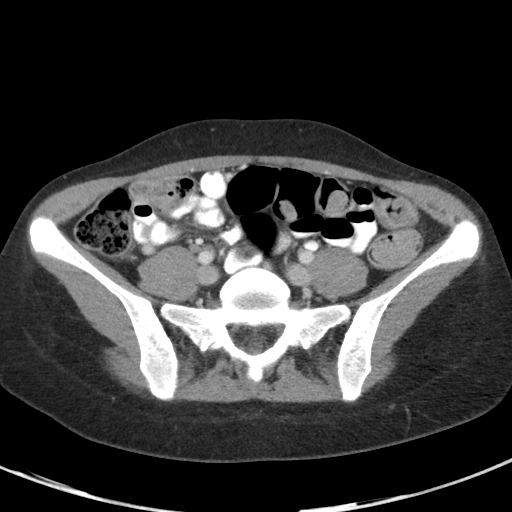
[im 43/85  soft-tissue]
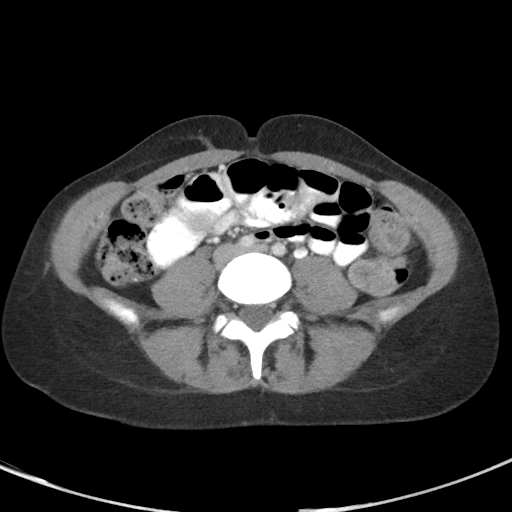
[im 49/85  soft-tissue]
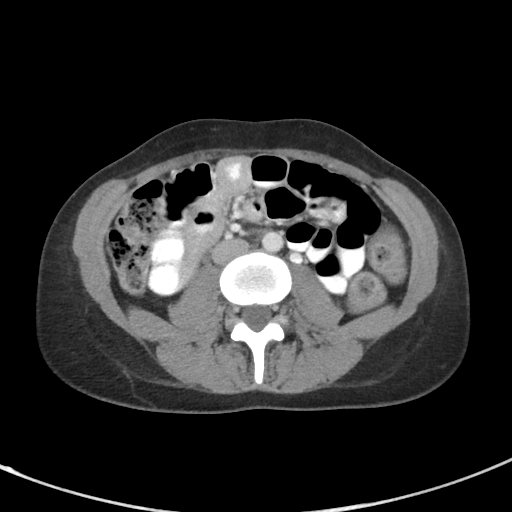
[im 55/85  soft-tissue]
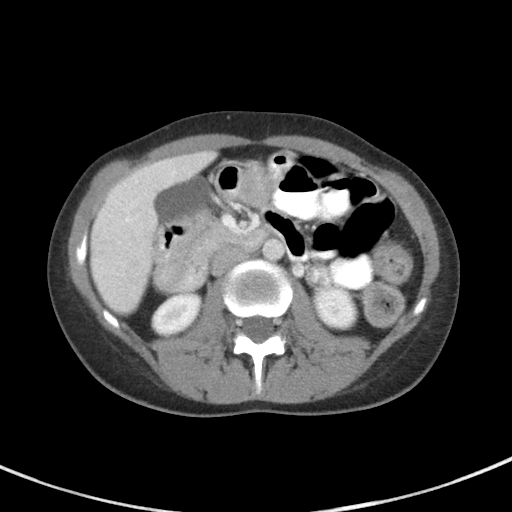
[im 55/85  bone]
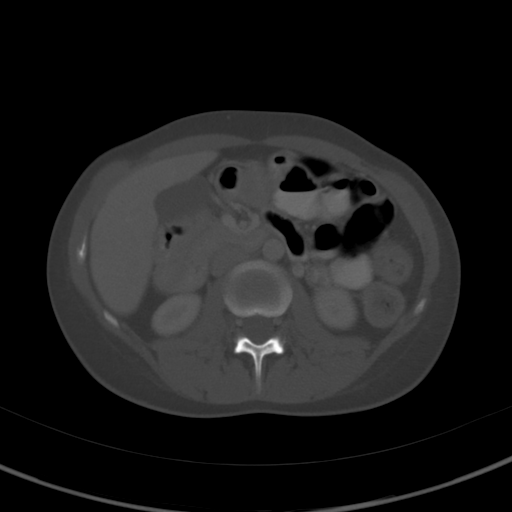
[im 62/85  soft-tissue]
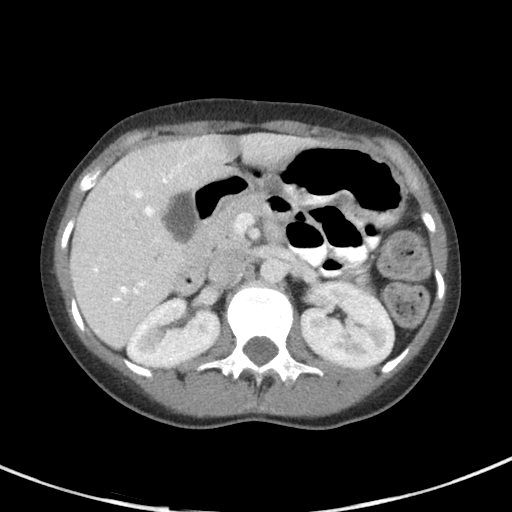
[im 68/85  soft-tissue]
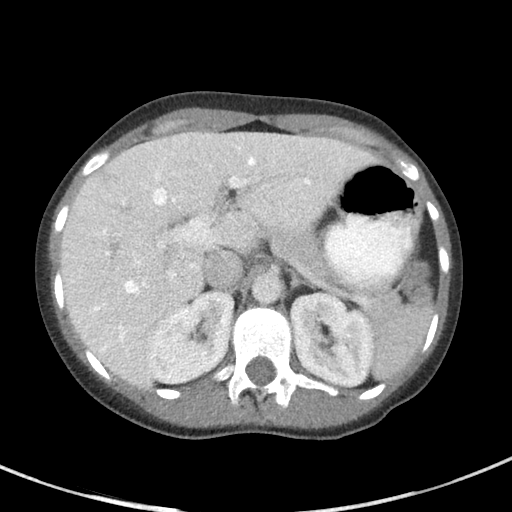
[im 75/85  soft-tissue]
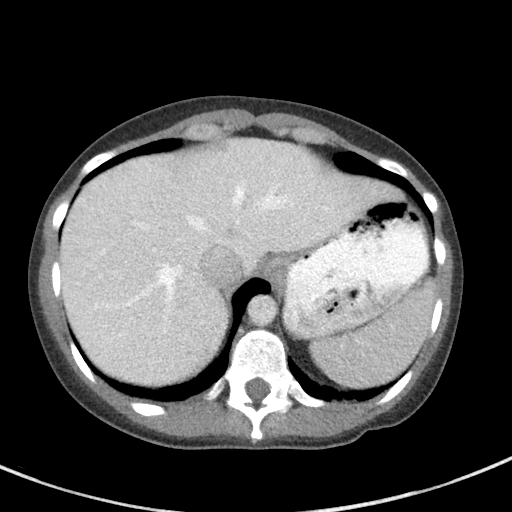
[im 81/85  soft-tissue]
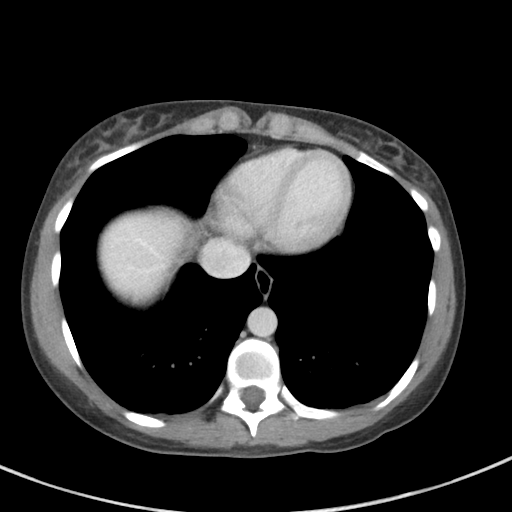

[Series 5: coronal st · coronal · 0.63mm/px · 3 of 62 slices shown]
[im 21/62  soft-tissue]
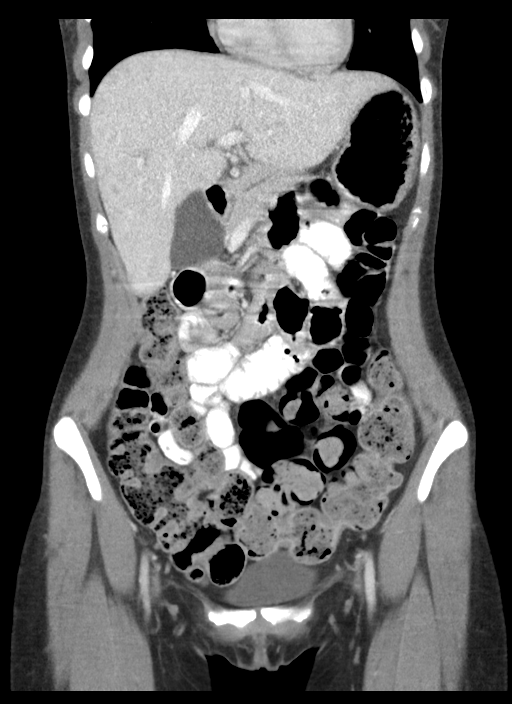
[im 28/62  soft-tissue]
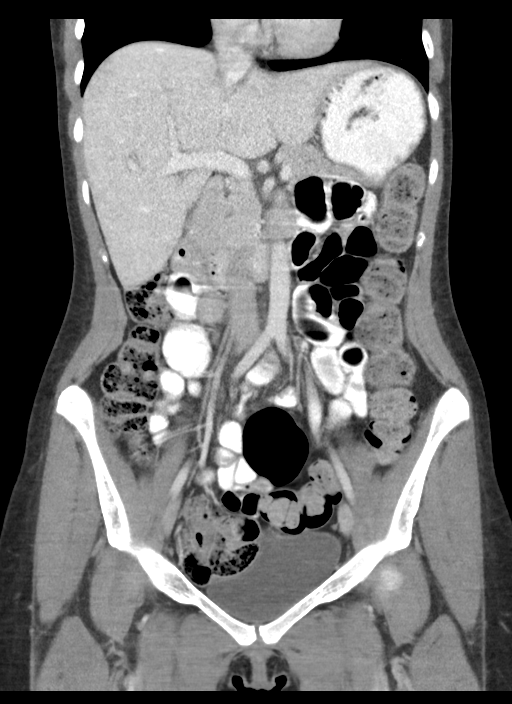
[im 34/62  soft-tissue]
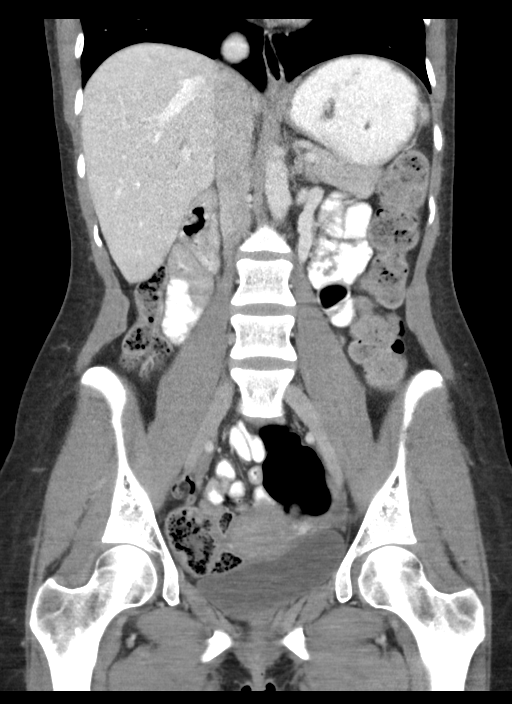

[16 of 46 positions shown; findings below may reference images not displayed]

FINDINGS: Mild dependent atelectasis in the bases.

Small focal low-attenuation lesions are demonstrated in the left
lobe of the liver probably representing areas of focal fatty
infiltration. The gallbladder, pancreas, adrenal glands, kidneys,
abdominal aorta, inferior vena cava, and retroperitoneal lymph nodes
are unremarkable. Stomach, small bowel, and colon are not abnormally
distended. Diffusely stool-filled colon. There is possible wall
thickening in the duodenum which could be due to underdistention or
duodenitis. No free air or free fluid in the abdomen.

Pelvis: Uterus and ovaries are not enlarged. Bladder wall is not
thickened. No free or loculated pelvic fluid collections. No pelvic
mass or lymphadenopathy. Appendix is normal. No destructive bone
lesions.
IMPRESSION: Suggestion of wall thickening in the duodenum which could indicate
under distention or duodenitis. Otherwise, no acute process
demonstrated in the abdomen or pelvis. Probable focal areas of fatty
infiltration.

## 2019-05-01 IMAGING — CR DG FINGER THUMB 2+V*L*
3 series · 3 of 3 positions shown · non-contrast
Comparison: None.

CLINICAL DATA: Left thumb got caught in a vacuum cleaner.

EXAM:
LEFT THUMB 2+V

[x finger pa left]
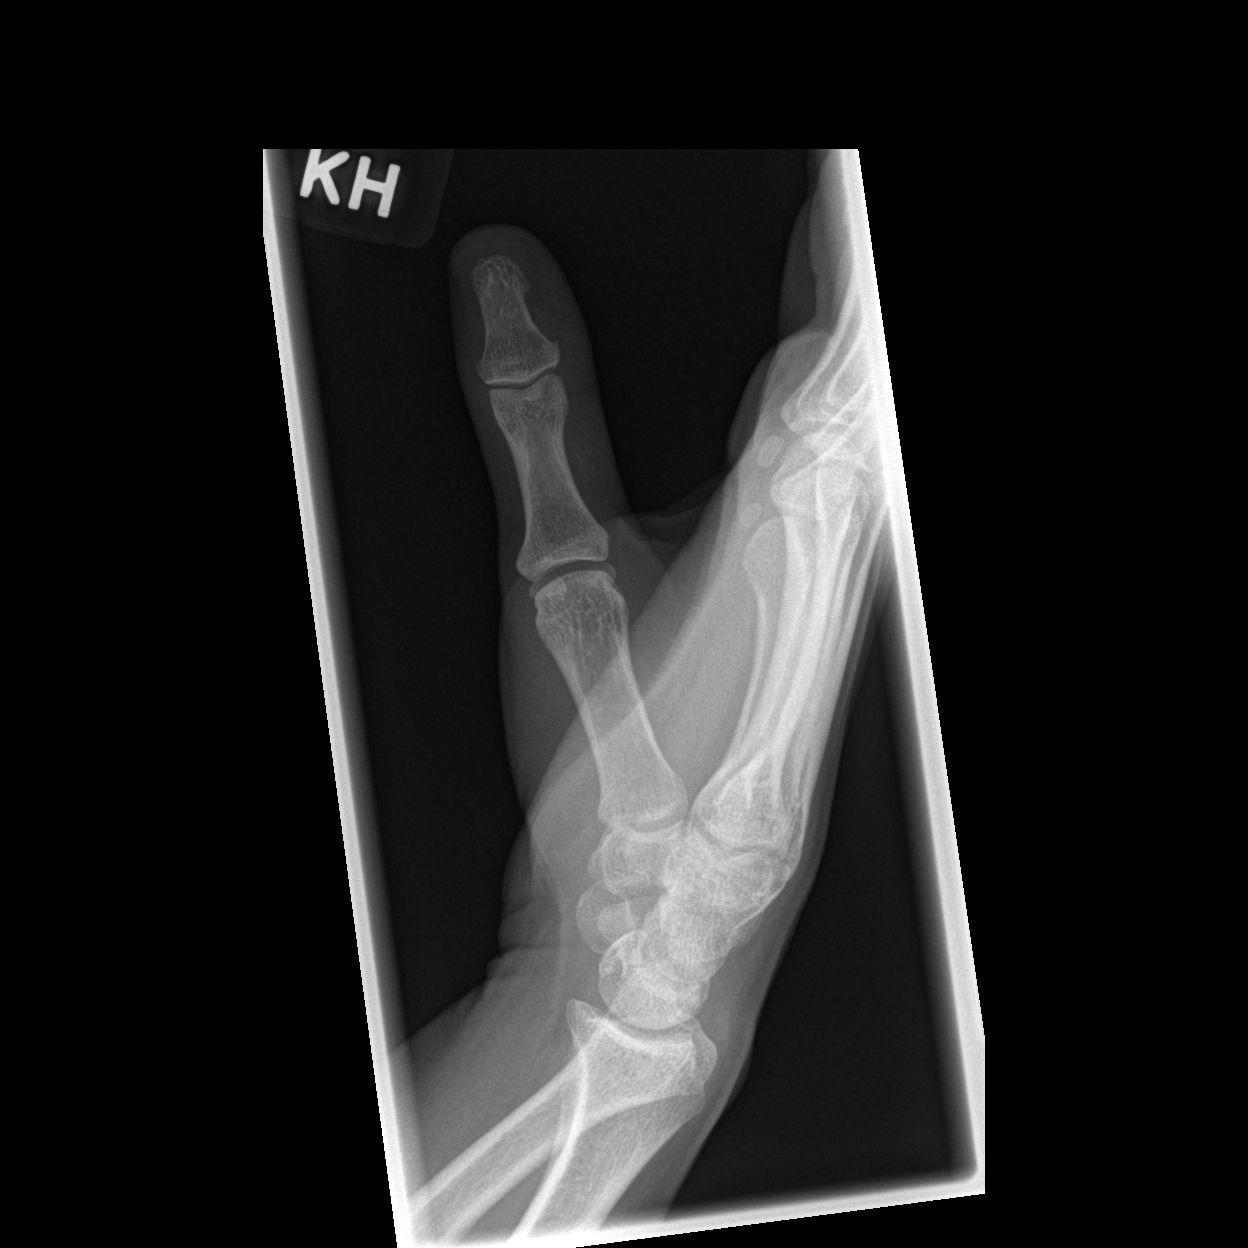

[x finger obl. left]
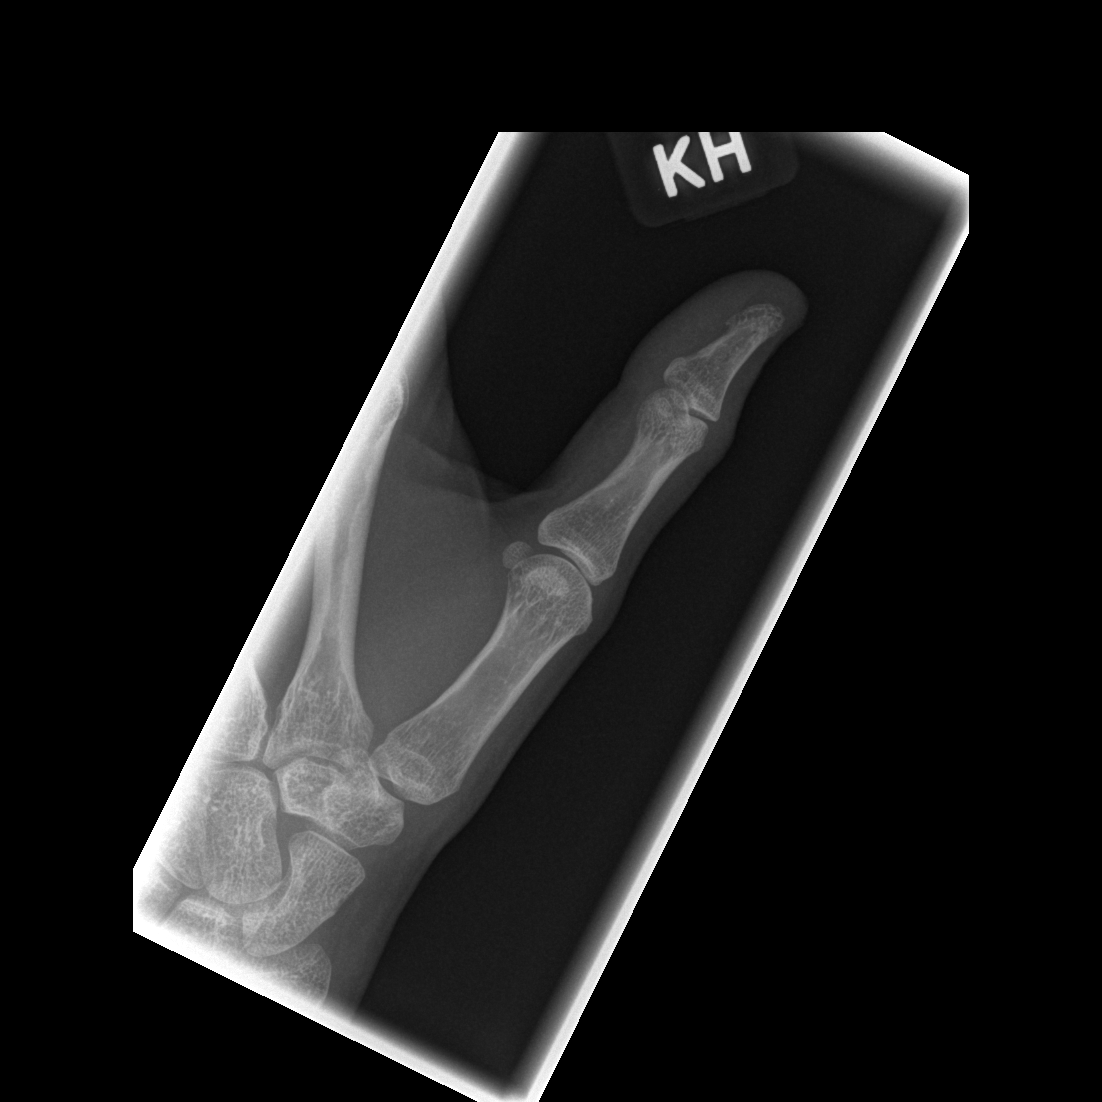

[x finger lateral left]
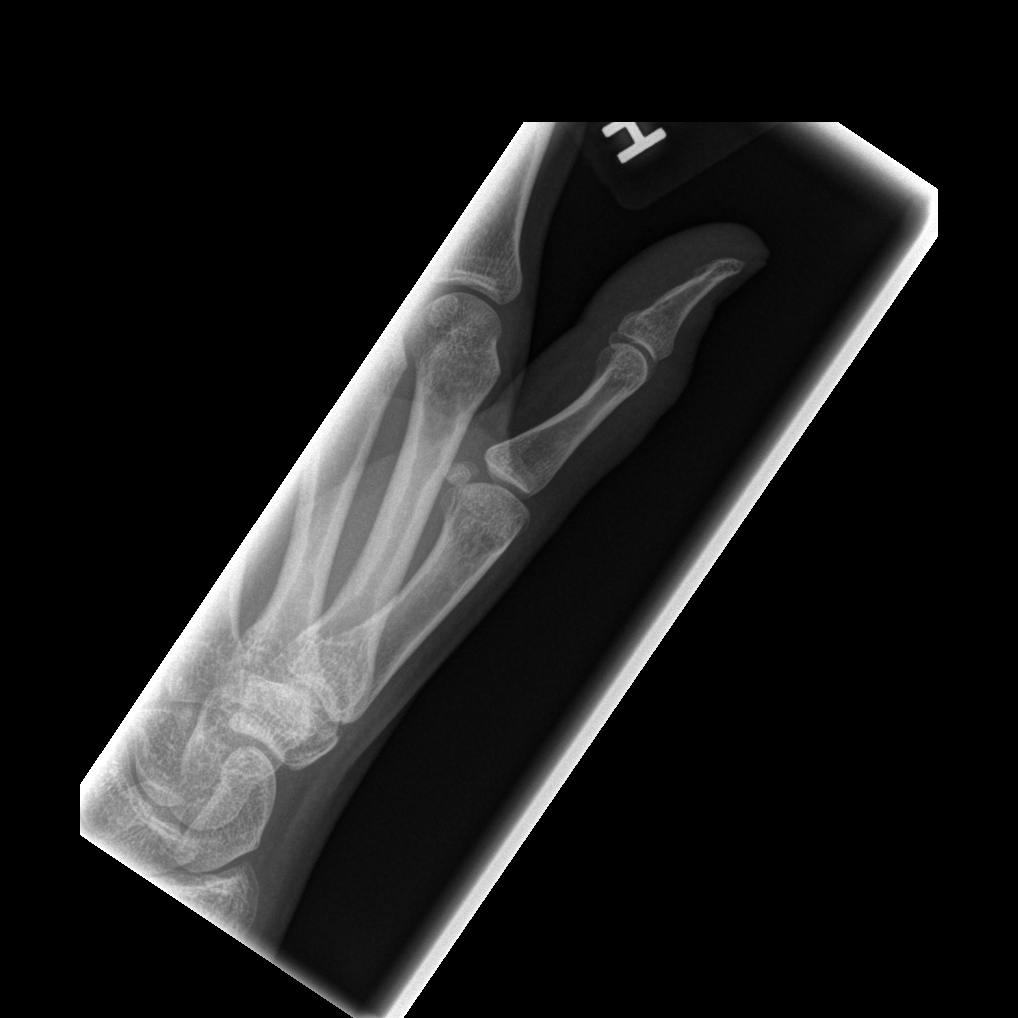

[3 of 3 positions shown; findings below may reference images not displayed]

FINDINGS: There is no evidence of fracture or dislocation. There is no
evidence of arthropathy or other focal bone abnormality. Soft
tissues are unremarkable
IMPRESSION: Negative.
